# Patient Record
Sex: Female | Born: 1990 | Hispanic: No | Marital: Married
Health system: Southern US, Community
[De-identification: ages and names within clinical notes are randomized; demographics above are authoritative.]

## PROBLEM LIST (undated history)

## (undated) HISTORY — PX: EYE SURGERY: SHX253

---

## 2019-06-21 ENCOUNTER — Ambulatory Visit: Payer: Self-pay | Attending: Internal Medicine

## 2019-06-21 ENCOUNTER — Other Ambulatory Visit: Payer: Self-pay

## 2019-06-21 DIAGNOSIS — Z23 Encounter for immunization: Secondary | ICD-10-CM

## 2019-06-21 NOTE — Progress Notes (Signed)
   Covid-19 Vaccination Clinic  Name:  Amber Ellis    MRN: [id number] DOB: 02-17-1991  06/21/2019  Ms. [name] was observed post Covid-19 immunization for 15 minutes without incident. She was provided with Vaccine Information Sheet and instruction to access the V-Safe system.   Ms. [name] was instructed to call 911 with any severe reactions post vaccine: Marland Kitchen Difficulty breathing  . Swelling of face and throat  . A fast heartbeat  . A bad rash all over body  . Dizziness and weakness   Immunizations Administered    Name Date Dose VIS Date Route   Pfizer COVID-19 Vaccine 06/21/2019  4:00 PM 0.3 mL 02/28/2019 Intramuscular   Manufacturer: [employer], [employer]   Lot: [id number]   NDC: [address]-4

## 2019-07-02 ENCOUNTER — Other Ambulatory Visit: Payer: Self-pay

## 2019-07-02 ENCOUNTER — Emergency Department: Payer: Self-pay

## 2019-07-02 ENCOUNTER — Encounter: Payer: Self-pay | Admitting: *Deleted

## 2019-07-02 ENCOUNTER — Emergency Department
Admission: EM | Admit: 2019-07-02 | Discharge: 2019-07-02 | Disposition: A | Discharge status: Home / Self Care | Payer: Self-pay | Attending: Emergency Medicine | Admitting: Emergency Medicine

## 2019-07-02 DIAGNOSIS — Y93I9 Activity, other involving external motion: Secondary | ICD-10-CM | POA: Insufficient documentation

## 2019-07-02 DIAGNOSIS — Y92410 Unspecified street and highway as the place of occurrence of the external cause: Secondary | ICD-10-CM | POA: Diagnosis not present

## 2019-07-02 DIAGNOSIS — M542 Cervicalgia: Secondary | ICD-10-CM | POA: Diagnosis not present

## 2019-07-02 DIAGNOSIS — M25521 Pain in right elbow: Secondary | ICD-10-CM | POA: Insufficient documentation

## 2019-07-02 DIAGNOSIS — Y999 Unspecified external cause status: Secondary | ICD-10-CM | POA: Insufficient documentation

## 2019-07-02 MED ORDER — MELOXICAM 15 MG PO TABS: 15.0000 mg | ORAL_TABLET | Freq: Every day | ORAL | 1 refills | Status: AC

## 2019-07-02 MED ORDER — METHOCARBAMOL 500 MG PO TABS: 500.0000 mg | ORAL_TABLET | Freq: Three times a day (TID) | ORAL | 0 refills | Status: AC | PRN
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
## 2019-07-12 ENCOUNTER — Ambulatory Visit: Payer: Self-pay | Attending: Internal Medicine

## 2019-07-12 DIAGNOSIS — Z23 Encounter for immunization: Secondary | ICD-10-CM

## 2019-07-12 NOTE — Progress Notes (Signed)
   Covid-19 Vaccination Clinic  Name:  Amber Ellis    MRN: [id number] DOB: [birth year]  07/12/2019  Amber Ellis was observed post Covid-19 immunization for 15 minutes without incident. She was provided with Vaccine Information Sheet and instruction to access the V-Safe system.   Amber Ellis was instructed to call 911 with any severe reactions post vaccine: Marland Kitchen Difficulty breathing  . Swelling of face and throat  . A fast heartbeat  . A bad rash all over body  . Dizziness and weakness   Immunizations Administered    Name Date Dose VIS Date Route   Pfizer COVID-19 Vaccine 07/12/2019  4:20 PM 0.3 mL 05/14/2018 Intramuscular   Manufacturer: [employer], [employer]   Lot: [id number]   NDC: [address]-0

## 2020-08-14 ENCOUNTER — Other Ambulatory Visit: Payer: Self-pay

## 2020-08-14 ENCOUNTER — Ambulatory Visit
Admission: EM | Admit: 2020-08-14 | Discharge: 2020-08-14 | Disposition: A | Discharge status: Home / Self Care | Payer: BC Managed Care – PPO | Attending: Sports Medicine | Admitting: Sports Medicine

## 2020-08-14 ENCOUNTER — Encounter: Payer: Self-pay | Admitting: Gynecology

## 2020-08-14 DIAGNOSIS — S31831A Laceration without foreign body of anus, initial encounter: Secondary | ICD-10-CM

## 2020-08-14 MED ORDER — HYDROCORTISONE (PERIANAL) 2.5 % EX CREA: 1.0000 "application " | TOPICAL_CREAM | Freq: Two times a day (BID) | CUTANEOUS | 0 refills | Status: AC

## 2020-08-14 NOTE — Discharge Instructions (Signed)
As we discussed, you have a small anal tear.  There is no fissure.  You do not actually have a hemorrhoid. I did prescribe a rectal cream to see if that helps. I also gave you the name of a general surgeon if your symptoms persist that you can call and see if necessary. Please see educational handouts. Over-the-counter meds as needed. I did give you a work note. Follow-up as needed.

## 2020-08-14 NOTE — ED Triage Notes (Signed)
Patient does not speak english. Per pt. Spouse , pt with slight cut at anus. Spouse not sure it if is hemorrhoids.

## 2020-08-14 NOTE — ED Provider Notes (Signed)
[facility]    CSN: [id number] Arrival date & time: 08/14/20  0934      History   Chief Complaint No chief complaint on file.   HPI Amber Ellis is a 30 y.o. female.   30 year old Spanish-speaking only female who presents with her spouse who is translating and speaks fluent English for evaluation of the above issue.  She does not have a PCP.  Per history, she has had some bleeding on the toilet paper for 2 weeks.  No accidents trauma.  No anal sex.  Husband looked and it does not appear to be a hemorrhoid.  He noted a small little cut at the anus.  No significant GI history.  No history of Crohn's or ulcerative colitis.  No history of anal fissures in the past.  She has not sought out any medical care.  She works over at [employer] and was scheduled to work today and is asking for a work note.  No fever shakes chills.  No significant bleeding in the toilet just a little blood on the toilet paper when she wipes.  No urinary symptoms.  The remainder of the history is noncontributory.  No red flag signs or symptoms elicited on history.     History reviewed. No pertinent past medical history.  There are no problems to display for this patient.   Past Surgical History:  Procedure Laterality Date  . EYE SURGERY      OB History   No obstetric history on file.      Home Medications    Prior to Admission medications   Medication Sig Start Date End Date Taking? Authorizing Provider  hydrocortisone (ANUSOL-HC) 2.5 % rectal cream Place 1 application rectally 2 (two) times daily. 08/14/20  Yes Delton See, MD    Family History Family History  Problem Relation Age of Onset  . Heart attack Father     Social History Social History   Tobacco Use  . Smoking status: Never Smoker  . Smokeless tobacco: Never Used  Substance Use Topics  . Alcohol use: Not Currently  . Drug use: Not Currently     Allergies   Patient has no known  allergies.   Review of Systems Review of Systems  Constitutional: Negative for activity change, appetite change, chills, diaphoresis, fatigue and fever.  HENT: Negative for congestion, ear pain, postnasal drip, rhinorrhea, sinus pressure, sinus pain, sneezing and sore throat.   Eyes: Negative for pain.  Respiratory: Negative for cough, chest tightness and shortness of breath.   Cardiovascular: Negative for chest pain and palpitations.  Gastrointestinal: Positive for anal bleeding and rectal pain. Negative for abdominal pain, blood in stool, constipation, diarrhea, nausea and vomiting.  Genitourinary: Negative for dysuria, pelvic pain, vaginal bleeding, vaginal discharge and vaginal pain.  Musculoskeletal: Negative for back pain, myalgias and neck pain.  Skin: Negative for color change, pallor, rash and wound.  Neurological: Negative for dizziness, light-headedness and headaches.  Hematological: Negative for adenopathy. Does not bruise/bleed easily.  All other systems reviewed and are negative.    Physical Exam Triage Vital Signs ED Triage Vitals  Enc Vitals Group     BP 08/14/20 0950 120/83     Pulse Rate 08/14/20 0950 62     Resp 08/14/20 0950 16     Temp 08/14/20 0950 98.3 F (36.8 C)     Temp Source 08/14/20 0950 Oral     SpO2 08/14/20 0950 100 %     Weight 08/14/20 0956 209  lb (94.8 kg)     Height 08/14/20 0956 5\' 1"  (1.549 m)     Head Circumference --      Peak Flow --      Pain Score 08/14/20 0956 4     Pain Loc --      Pain Edu? --      Excl. in GC? --    No data found.  Updated Vital Signs BP 120/83 (BP Location: Left Arm)   Pulse 62   Temp 98.3 F (36.8 C) (Oral)   Resp 16   Ht 5\' 1"  (1.549 m)   Wt 94.8 kg   LMP 06/14/2020   SpO2 100%   BMI 39.49 kg/m   Visual Acuity Right Eye Distance:   Left Eye Distance:   Bilateral Distance:    Right Eye Near:   Left Eye Near:    Bilateral Near:     Physical Exam Vitals and nursing note reviewed. Exam  conducted with a chaperone present.  Constitutional:      General: She is not in acute distress.    Appearance: Normal appearance. She is obese. She is not ill-appearing, toxic-appearing or diaphoretic.  HENT:     Head: Normocephalic and atraumatic.     Nose: Nose normal.     Mouth/Throat:     Mouth: Mucous membranes are moist.  Eyes:     Conjunctiva/sclera: Conjunctivae normal.     Pupils: Pupils are equal, round, and reactive to light.  Cardiovascular:     Rate and Rhythm: Normal rate and regular rhythm.     Pulses: Normal pulses.     Heart sounds: Normal heart sounds. No murmur heard. No friction rub. No gallop.   Pulmonary:     Effort: Pulmonary effort is normal.     Breath sounds: Normal breath sounds. No stridor. No wheezing, rhonchi or rales.  Abdominal:     General: Abdomen is flat. There is no distension.     Tenderness: There is no abdominal tenderness. There is no right CVA tenderness, left CVA tenderness, guarding or rebound.  Genitourinary:    Rectum: Tenderness present. No anal fissure.        Comments: Very tiny anal tear present.  No bleeding noted.  No evidence of hemorrhoids. Musculoskeletal:     Cervical back: Normal range of motion and neck supple.  Skin:    General: Skin is warm and dry.     Capillary Refill: Capillary refill takes less than 2 seconds.  Neurological:     General: No focal deficit present.     Mental Status: She is alert and oriented to person, place, and time.      UC Treatments / Results  Labs (all labs ordered are listed, but only abnormal results are displayed) Labs Reviewed - No data to display  EKG   Radiology No results found.  Procedures Procedures (including critical care time)  Medications Ordered in UC Medications - No data to display  Initial Impression / Assessment and Plan / UC Course  I have reviewed the triage vital signs and the nursing notes.  Pertinent labs & imaging results that were available during  my care of the patient were reviewed by me and considered in my medical decision making (see chart for details).  Clinical impression: 2 weeks of blood on the tissue paper with a small anal tear that is not actively bleeding today.  No evidence of a hemorrhoid.  Treatment plan: 1.  The findings and treatment plan were  discussed in detail with the patient and her husband.  Her husband was interpreting.  All questions were encouraged and answered.  They both voiced verbal understanding. 2.  Reassured her that there was not anything of significant concern on examination today. 3.  We will try Anusol to see if that helps with her symptoms. 4.  Gave them contact information of general surgery if her symptoms persist that is where she should go as we have exhausted what treatment we can do here in the urgent care. 5.  Educational handouts provided. 6.  She does not have a PCP so if her symptoms were to worsen she needs to go to the ER. 7.  Work note was provided. 8.  She was discharged in stable condition and will follow-up here as needed.    Final Clinical Impressions(s) / UC Diagnoses   Final diagnoses:  Anal tear     Discharge Instructions     As we discussed, you have a small anal tear.  There is no fissure.  You do not actually have a hemorrhoid. I did prescribe a rectal cream to see if that helps. I also gave you the name of a general surgeon if your symptoms persist that you can call and see if necessary. Please see educational handouts. Over-the-counter meds as needed. I did give you a work note. Follow-up as needed.    ED Prescriptions    Medication Sig Dispense Auth. Provider   hydrocortisone (ANUSOL-HC) 2.5 % rectal cream Place 1 application rectally 2 (two) times daily. 30 g Delton See, MD     PDMP not reviewed this encounter.   Delton See, MD 08/14/20 1027
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
